# Patient Record
Sex: Male | Born: 1990 | Race: Black or African American | Hispanic: No | Marital: Single | Smoking: Current every day smoker
Health system: Southern US, Community
[De-identification: ages and names within clinical notes are randomized; demographics above are authoritative.]

## PROBLEM LIST (undated history)

## (undated) DIAGNOSIS — F329 Major depressive disorder, single episode, unspecified: Secondary | ICD-10-CM

## (undated) DIAGNOSIS — L309 Dermatitis, unspecified: Secondary | ICD-10-CM

## (undated) DIAGNOSIS — F319 Bipolar disorder, unspecified: Secondary | ICD-10-CM

## (undated) DIAGNOSIS — F32A Depression, unspecified: Secondary | ICD-10-CM

---

## 2012-05-10 ENCOUNTER — Ambulatory Visit (HOSPITAL_COMMUNITY)
Admission: AD | Admit: 2012-05-10 | Discharge: 2012-05-10 | Disposition: A | Discharge status: Home / Self Care | Payer: Medicaid Other | Attending: Psychiatry | Admitting: Psychiatry

## 2012-05-10 ENCOUNTER — Encounter (HOSPITAL_COMMUNITY): Payer: Self-pay | Admitting: *Deleted

## 2012-05-10 ENCOUNTER — Encounter (HOSPITAL_COMMUNITY): Payer: Self-pay | Admitting: Emergency Medicine

## 2012-05-10 ENCOUNTER — Emergency Department (HOSPITAL_COMMUNITY)
Admission: EM | Admit: 2012-05-10 | Discharge: 2012-05-11 | Disposition: A | Discharge status: Other Acute Inpatient Hospital | Payer: Medicaid Other | Source: Home / Self Care | Attending: Emergency Medicine | Admitting: Emergency Medicine

## 2012-05-10 DIAGNOSIS — R45851 Suicidal ideations: Secondary | ICD-10-CM

## 2012-05-10 DIAGNOSIS — F1910 Other psychoactive substance abuse, uncomplicated: Secondary | ICD-10-CM | POA: Insufficient documentation

## 2012-05-10 DIAGNOSIS — R443 Hallucinations, unspecified: Secondary | ICD-10-CM | POA: Insufficient documentation

## 2012-05-10 DIAGNOSIS — L309 Dermatitis, unspecified: Secondary | ICD-10-CM

## 2012-05-10 DIAGNOSIS — F1220 Cannabis dependence, uncomplicated: Secondary | ICD-10-CM | POA: Insufficient documentation

## 2012-05-10 DIAGNOSIS — F39 Unspecified mood [affective] disorder: Secondary | ICD-10-CM | POA: Insufficient documentation

## 2012-05-10 DIAGNOSIS — F1110 Opioid abuse, uncomplicated: Secondary | ICD-10-CM | POA: Insufficient documentation

## 2012-05-10 HISTORY — DX: Bipolar disorder, unspecified: F31.9

## 2012-05-10 HISTORY — DX: Dermatitis, unspecified: L30.9

## 2012-05-10 LAB — CBC
HCT: 40.6 % (ref 39.0–52.0)
Hemoglobin: 14.2 g/dL (ref 13.0–17.0)
MCV: 85.1 fL (ref 78.0–100.0)
Platelets: 234 10*3/uL (ref 150–400)
RBC: 4.77 MIL/uL (ref 4.22–5.81)
WBC: 7 10*3/uL (ref 4.0–10.5)

## 2012-05-10 LAB — RAPID URINE DRUG SCREEN, HOSP PERFORMED
Amphetamines: NOT DETECTED
Barbiturates: NOT DETECTED

## 2012-05-10 LAB — SALICYLATE LEVEL: Salicylate Lvl: <2 mg/dL — ABNORMAL LOW (ref 2.8–20.0)

## 2012-05-10 LAB — COMPREHENSIVE METABOLIC PANEL
AST: 19 U/L (ref 0–37)
CO2: 28 mEq/L (ref 19–32)
Chloride: 100 mEq/L (ref 96–112)
Creatinine, Ser: 0.91 mg/dL (ref 0.50–1.35)
GFR calc non Af Amer: >90 mL/min (ref >90)
Glucose, Bld: 87 mg/dL (ref 70–99)
Total Bilirubin: 0.3 mg/dL (ref 0.3–1.2)
Total Protein: 7.3 g/dL (ref 6.0–8.3)

## 2012-05-10 MED ORDER — ONDANSETRON HCL 4 MG PO TABS: 4.0000 mg | ORAL_TABLET | Freq: Three times a day (TID) | ORAL | Status: DC | PRN

## 2012-05-10 MED ORDER — LORAZEPAM 1 MG PO TABS: 1.0000 mg | ORAL_TABLET | Freq: Three times a day (TID) | ORAL | Status: DC | PRN

## 2012-05-10 MED ORDER — ZOLPIDEM TARTRATE 5 MG PO TABS: 5.0000 mg | ORAL_TABLET | Freq: Every evening | ORAL | Status: DC | PRN

## 2012-05-10 MED ORDER — ALUM & MAG HYDROXIDE-SIMETH 200-200-20 MG/5ML PO SUSP: 30.0000 mL | ORAL | Status: DC | PRN

## 2012-05-10 MED ORDER — NICOTINE 21 MG/24HR TD PT24
21.0000 mg | MEDICATED_PATCH | Freq: Every day | TRANSDERMAL | Status: DC
  Administered 2012-05-10 – 2012-05-11 (×2): 21 mg via TRANSDERMAL
  Filled 2012-05-10 (×2): qty 1

## 2012-05-10 NOTE — ED Provider Notes (Addendum)
History     CSN: [id number]  Arrival date & time 05/10/12  1332   First MD Initiated Contact with Patient 05/10/12 1347      Chief Complaint  Patient presents with  . Medical Clearance    (Consider location/radiation/quality/duration/timing/severity/associated sxs/prior treatment) HPI Pt presents with c/o depression, hearing voices, feeling suicidal and having homicidal thoughts.  He has a hx of bipolar.  Denies any recent illness including no fever, vomiting/diarrhea, no cough.  States he has been taking his medication normally.  Also states he uses substances, marijuana and narcotics.  There are no other associated systemic symptoms, there are no other alleviating or modifying factors.   Past Medical History  Diagnosis Date  . Eczema 05/10/2012  . Bipolar affective     History reviewed. No pertinent past surgical history.  History reviewed. No pertinent family history.  History  Substance Use Topics  . Smoking status: Current Every Day Smoker -- 0.2 packs/day for 4 years  . Smokeless tobacco: Never Used  . Alcohol Use:       Review of Systems ROS reviewed and all otherwise negative except for mentioned in HPI  Allergies  Review of patient's allergies indicates no known allergies.  Home Medications   Current Outpatient Rx  Name Route Sig Dispense Refill  . ARIPIPRAZOLE 10 MG PO TABS Oral Take 10 mg by mouth daily.     Marland Kitchen MINOCYCLINE HCL 100 MG PO CAPS Oral Take 100 mg by mouth 2 (two) times daily.      BP 139/82  Pulse 58  Temp 97.9 F (36.6 C) (Oral)  SpO2 100% Vitals reviewed Physical Exam Physical Examination: General appearance - alert, well appearing, and in no distress Mental status - alert, oriented to person, place, and time Eyes - pupils equal and reactive, no conjunctival injection, no scleral icterus Mouth - mucous membranes moist, pharynx normal without lesions Chest - clear to auscultation, no wheezes, rales or rhonchi, symmetric air  entry Heart - normal rate, regular rhythm, normal S1, S2, no murmurs, rubs, clicks or gallops Extremities - peripheral pulses normal, no pedal edema, no clubbing or cyanosis Skin - normal coloration and turgor, no rashes Psych- flat affect, calm and cooperative  ED Course  Procedures (including critical care time)  Labs Reviewed  COMPREHENSIVE METABOLIC PANEL - Abnormal; Notable for the following:    Potassium 3.4 (*)     All other components within normal limits  SALICYLATE LEVEL - Abnormal; Notable for the following:    Salicylate Lvl <2.0 (*)     All other components within normal limits  URINE RAPID DRUG SCREEN (HOSP PERFORMED) - Abnormal; Notable for the following:    Benzodiazepines POSITIVE (*)     Tetrahydrocannabinol POSITIVE (*)     All other components within normal limits  ACETAMINOPHEN LEVEL  CBC  ETHANOL   No results found.   1. Hallucination   2. Suicidal ideation   3. Substance abuse       MDM  Pt presents after assessment at [facility] for depression, SI/HI, substance abuse, paranoia.  Per ACT team he has been accepted there but no bed available.  Medical clearance labs obtained, holding orders written.          Ethelda Chick, MD 05/10/12 1548  7:57 AM  Pt seen and evaluated in the psych ED. He is resting comfortably, remains calm and cooperative.  He had been accepted at [facility] prior to arrival to ED.  Awaiting bed availability.  Ethelda Chick, MD 05/11/12 [phone]
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
## 2012-05-10 NOTE — ED Notes (Signed)
Pt from [facility] for medical clearance.  Pt with SI with plan to hang self, HI towards former friends in [facility], visual hallucinations of shadows, paranoia with fears that a plane will crash into his house and that people are out to kill him.  Pt uses marijuana daily, takes Vicodin and Percocet recreationally.  Pt calm and cooperative at this time.

## 2012-05-11 ENCOUNTER — Encounter (HOSPITAL_COMMUNITY): Payer: Self-pay | Admitting: *Deleted

## 2012-05-11 ENCOUNTER — Inpatient Hospital Stay (HOSPITAL_COMMUNITY)
Admission: AD | Admit: 2012-05-11 | Discharge: 2012-05-14 | DRG: 885 | Disposition: A | Discharge status: Home / Self Care | Payer: Medicaid Other | Source: Ambulatory Visit | Attending: Psychiatry | Admitting: Psychiatry

## 2012-05-11 DIAGNOSIS — R6250 Unspecified lack of expected normal physiological development in childhood: Secondary | ICD-10-CM | POA: Diagnosis present

## 2012-05-11 DIAGNOSIS — F3189 Other bipolar disorder: Principal | ICD-10-CM | POA: Diagnosis present

## 2012-05-11 DIAGNOSIS — F1994 Other psychoactive substance use, unspecified with psychoactive substance-induced mood disorder: Secondary | ICD-10-CM | POA: Diagnosis present

## 2012-05-11 DIAGNOSIS — F1210 Cannabis abuse, uncomplicated: Secondary | ICD-10-CM | POA: Diagnosis present

## 2012-05-11 DIAGNOSIS — F1110 Opioid abuse, uncomplicated: Secondary | ICD-10-CM | POA: Diagnosis present

## 2012-05-11 DIAGNOSIS — F17200 Nicotine dependence, unspecified, uncomplicated: Secondary | ICD-10-CM | POA: Diagnosis present

## 2012-05-11 DIAGNOSIS — Z23 Encounter for immunization: Secondary | ICD-10-CM

## 2012-05-11 DIAGNOSIS — F4010 Social phobia, unspecified: Secondary | ICD-10-CM

## 2012-05-11 DIAGNOSIS — F1290 Cannabis use, unspecified, uncomplicated: Secondary | ICD-10-CM

## 2012-05-11 DIAGNOSIS — Z79899 Other long term (current) drug therapy: Secondary | ICD-10-CM

## 2012-05-11 DIAGNOSIS — F3181 Bipolar II disorder: Secondary | ICD-10-CM | POA: Diagnosis present

## 2012-05-11 DIAGNOSIS — L259 Unspecified contact dermatitis, unspecified cause: Secondary | ICD-10-CM | POA: Diagnosis present

## 2012-05-11 DIAGNOSIS — R45851 Suicidal ideations: Secondary | ICD-10-CM

## 2012-05-11 DIAGNOSIS — F1120 Opioid dependence, uncomplicated: Secondary | ICD-10-CM | POA: Diagnosis present

## 2012-05-11 HISTORY — DX: Depression, unspecified: F32.A

## 2012-05-11 HISTORY — DX: Major depressive disorder, single episode, unspecified: F32.9

## 2012-05-11 MED ORDER — NAPROXEN 500 MG PO TABS: 500.0000 mg | ORAL_TABLET | Freq: Two times a day (BID) | ORAL | Status: DC | PRN

## 2012-05-11 MED ORDER — MINOCYCLINE HCL 100 MG PO CAPS
100.0000 mg | ORAL_CAPSULE | Freq: Two times a day (BID) | ORAL | Status: DC
  Administered 2012-05-11 (×2): 100 mg via ORAL
  Filled 2012-05-11 (×2): qty 1

## 2012-05-11 MED ORDER — MAGNESIUM HYDROXIDE 400 MG/5ML PO SUSP: 30.0000 mL | Freq: Every day | ORAL | Status: DC | PRN

## 2012-05-11 MED ORDER — DICYCLOMINE HCL 20 MG PO TABS: 20.0000 mg | ORAL_TABLET | Freq: Four times a day (QID) | ORAL | Status: DC | PRN

## 2012-05-11 MED ORDER — ALUM & MAG HYDROXIDE-SIMETH 200-200-20 MG/5ML PO SUSP: 30.0000 mL | ORAL | Status: DC | PRN

## 2012-05-11 MED ORDER — CLONIDINE HCL 0.1 MG PO TABS
0.1000 mg | ORAL_TABLET | Freq: Four times a day (QID) | ORAL | Status: DC
  Administered 2012-05-11 – 2012-05-12 (×2): 0.1 mg via ORAL
  Filled 2012-05-11 (×7): qty 1

## 2012-05-11 MED ORDER — ACETAMINOPHEN 325 MG PO TABS: 650.0000 mg | ORAL_TABLET | Freq: Four times a day (QID) | ORAL | Status: DC | PRN

## 2012-05-11 MED ORDER — ONDANSETRON 4 MG PO TBDP: 4.0000 mg | ORAL_TABLET | Freq: Four times a day (QID) | ORAL | Status: DC | PRN

## 2012-05-11 MED ORDER — CLONIDINE HCL 0.1 MG PO TABS: 0.1000 mg | ORAL_TABLET | ORAL | Status: DC

## 2012-05-11 MED ORDER — CLONIDINE HCL 0.1 MG PO TABS: 0.1000 mg | ORAL_TABLET | Freq: Every day | ORAL | Status: DC

## 2012-05-11 MED ORDER — LOPERAMIDE HCL 2 MG PO CAPS: 2.0000 mg | ORAL_CAPSULE | ORAL | Status: DC | PRN

## 2012-05-11 MED ORDER — HYDROXYZINE HCL 25 MG PO TABS: 25.0000 mg | ORAL_TABLET | Freq: Four times a day (QID) | ORAL | Status: DC | PRN

## 2012-05-11 MED ORDER — TRAZODONE HCL 50 MG PO TABS
50.0000 mg | ORAL_TABLET | Freq: Every evening | ORAL | Status: DC | PRN
  Administered 2012-05-11 – 2012-05-13 (×3): 50 mg via ORAL
  Filled 2012-05-11 (×10): qty 1

## 2012-05-11 MED ORDER — METHOCARBAMOL 500 MG PO TABS: 500.0000 mg | ORAL_TABLET | Freq: Three times a day (TID) | ORAL | Status: DC | PRN

## 2012-05-11 MED ORDER — ARIPIPRAZOLE 10 MG PO TABS
10.0000 mg | ORAL_TABLET | Freq: Every day | ORAL | Status: DC
  Administered 2012-05-11: 10 mg via ORAL
  Filled 2012-05-11: qty 1

## 2012-05-12 ENCOUNTER — Encounter (HOSPITAL_COMMUNITY): Payer: Self-pay | Admitting: *Deleted

## 2012-05-12 DIAGNOSIS — F1290 Cannabis use, unspecified, uncomplicated: Secondary | ICD-10-CM | POA: Diagnosis present

## 2012-05-12 DIAGNOSIS — F1120 Opioid dependence, uncomplicated: Secondary | ICD-10-CM | POA: Diagnosis present

## 2012-05-12 DIAGNOSIS — F4010 Social phobia, unspecified: Secondary | ICD-10-CM | POA: Diagnosis present

## 2012-05-12 DIAGNOSIS — F1010 Alcohol abuse, uncomplicated: Secondary | ICD-10-CM

## 2012-05-12 DIAGNOSIS — F332 Major depressive disorder, recurrent severe without psychotic features: Secondary | ICD-10-CM

## 2012-05-12 DIAGNOSIS — F1994 Other psychoactive substance use, unspecified with psychoactive substance-induced mood disorder: Secondary | ICD-10-CM | POA: Diagnosis present

## 2012-05-12 LAB — TSH: TSH: 0.387 u[IU]/mL (ref 0.350–4.500)

## 2012-05-12 MED ORDER — PROPRANOLOL HCL ER 60 MG PO CP24
60.0000 mg | ORAL_CAPSULE | Freq: Every day | ORAL | Status: DC
  Administered 2012-05-12 – 2012-05-14 (×3): 60 mg via ORAL
  Filled 2012-05-12 (×5): qty 1

## 2012-05-12 MED ORDER — RISPERIDONE 1 MG PO TABS
1.0000 mg | ORAL_TABLET | Freq: Every day | ORAL | Status: DC
  Filled 2012-05-12: qty 1

## 2012-05-12 MED ORDER — PNEUMOCOCCAL VAC POLYVALENT 25 MCG/0.5ML IJ INJ: 0.5000 mL | INJECTION | INTRAMUSCULAR | Status: AC

## 2012-05-12 MED ORDER — RISPERIDONE 0.25 MG PO TABS
0.2500 mg | ORAL_TABLET | Freq: Two times a day (BID) | ORAL | Status: DC
  Filled 2012-05-12 (×2): qty 1

## 2012-05-12 MED ORDER — RISPERIDONE 0.25 MG PO TABS
0.2500 mg | ORAL_TABLET | Freq: Two times a day (BID) | ORAL | Status: DC
  Administered 2012-05-12 – 2012-05-13 (×2): 0.25 mg via ORAL
  Filled 2012-05-12 (×6): qty 1

## 2012-05-12 MED ORDER — CITALOPRAM HYDROBROMIDE 10 MG PO TABS
10.0000 mg | ORAL_TABLET | Freq: Every day | ORAL | Status: DC
  Administered 2012-05-12: 10 mg via ORAL
  Filled 2012-05-12 (×2): qty 1

## 2012-05-12 MED ORDER — NICOTINE 7 MG/24HR TD PT24
7.0000 mg | MEDICATED_PATCH | Freq: Every day | TRANSDERMAL | Status: DC
  Administered 2012-05-12 – 2012-05-14 (×3): 7 mg via TRANSDERMAL
  Filled 2012-05-12 (×5): qty 1

## 2012-05-12 MED ORDER — CLONIDINE HCL ER 0.1 MG PO TB12: 0.1000 mg | ORAL_TABLET | ORAL | Status: DC | PRN

## 2012-05-12 MED ORDER — INFLUENZA VIRUS VACC SPLIT PF IM SUSP: 0.5000 mL | INTRAMUSCULAR | Status: AC

## 2012-05-12 MED ORDER — RISPERIDONE 1 MG PO TABS
1.0000 mg | ORAL_TABLET | Freq: Every day | ORAL | Status: DC
  Administered 2012-05-12: 1 mg via ORAL
  Filled 2012-05-12 (×3): qty 1

## 2012-05-12 NOTE — [facility] Suicide Risk Assessment (Signed)
Suicide Risk Assessment  Admission Assessment     Nursing information obtained from:  Patient Demographic factors:  Male;Adolescent or young adult;Low socioeconomic status;Unemployed Current Mental Status:  NA (Currently denies) Loss Factors:  Loss of significant relationship;Financial problems / change in socioeconomic status Historical Factors:  NA Risk Reduction Factors:  Sense of responsibility to family;Responsible for children under 19 years of age;Positive coping skills or problem solving skills (GF may be pregnant)  CLINICAL FACTORS:   Severe Anxiety and/or Agitation Bipolar Disorder:   Bipolar II Alcohol/Substance Abuse/Dependencies Previous Psychiatric Diagnoses and Treatments  COGNITIVE FEATURES THAT CONTRIBUTE TO RISK:  Thought constriction (tunnel vision)    SUICIDE RISK:   Moderate:  Frequent suicidal ideation with limited intensity, and duration, some specificity in terms of plans, no associated intent, good self-control, limited dysphoria/symptomatology, some risk factors present, and identifiable protective factors, including available and accessible social support.  Reason for hospitalization: .suicidal thoughts ans substance abuse issues  Risk: Risk of harm to self is elevated by his anxiety, his bipolar disorder, and his addiction issues.  Risk of harm to others is minimal in that he has not been involved in fights or had any legal charges filed on him.  Start Risperdal for bipolar disorder and Inderal LA for social anxiety.  Discussed the risks, benefits, and probable clinical course with and without treatment.  Pt is agreeable to the current course of treatment. We will continue on q. 15 checks the unit protocol. At this time there is no clinical indication for one-to-one observation as patient contract for safety and presents little risk to harm themself and others.  We will increase collateral information. I encourage patient to participate in group milieu  therapy. Pt will be seen in treatment team soon for further treatment and appropriate discharge planning. Please see history and physical note for more detailed information ELOS: 3 to 5 days.    Marylee Belzer 05/12/2012, 10:10 PM

## 2012-05-12 NOTE — Progress Notes (Signed)
Patient seen during during d/c planning group and or treatment team.  He reports admitting to the hospital due to abusing THC and opiods.  He shared he has been smoking THC for six years and using perocets for a year and a half.  He currently denies SI/HI.  He rates depression and anxiety at three.  Patient is not interested in residential treatment.  He plans to return to his mother's home and follow up with outpatient services.
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
## 2012-05-12 NOTE — Progress Notes (Signed)
[facility] Group Notes:  (Counselor/Nursing/MHT/Case Management/Adjunct) 05/12/2012  1:15pm-2:30pm Feelings About Diagnosis   Type of Therapy:  Group Therapy  Participation Level:  Did Not Attend     Angus Palms, LCSW 05/12/2012  4:06 PM

## 2012-05-12 NOTE — Progress Notes (Signed)
Psychoeducational Group Note  Date:  05/12/2012 Time: 1000   Group Topic/Focus:  Self Care:   The focus of this group is to help patients understand the importance of self-care in order to improve or restore emotional, physical, spiritual, interpersonal, and financial health.  Participation Level:  Did Not Attend  Participation Quality:    Affect:    Cognitive:    Insight:    Engagement in Group:    Additional Comments:  none  Brian Buchanan 05/12/2012, 12:47 PM
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
## 2012-05-12 NOTE — Tx Team (Signed)
Interdisciplinary Treatment Plan Update (Adult)  Date:  05/12/2012  Time Reviewed:  10:08 AM   Progress in Treatment: Attending groups:   Yes   Participating in groups:  Yes Taking medication as prescribed:  Yes Tolerating medication:  Yes Family/Significant othe contact made: Counselor to follow up on collateral contat Patient understands diagnosis:  Yes Discussing patient identified problems/goals with staff: Yes Medical problems stabilized or resolved: Yes Denies suicidal/homicidal ideation:Yes Issues/concerns per patient self-inventory:  Other:  New problem(s) identified:  Reason for Continuation of Hospitalization: Medication stabilization Suicidal ideation  Interventions implemented related to continuation of hospitalization:  Medication Management; safety checks q 15 mins  Additional comments:  Estimated length of stay: 3-4  days  Discharge Plan: Home with outpatient follow up  New goal(s):  Review of initial/current patient goals per problem list:    1.  Goal(s): Detox from Opiods  Met:  No  Target date: d/c  As evidenced by:  Detox will be completed  2.  Goal (s): .stabilize on meds   Met:  No  Target date: d/c  As evidenced by: Patient reports being stabilized on medications - less symptomatic    3.  Goal(s): Eliminate SI/other thoughts of self harm   Met:  Yes  Target date: d/c  As evidenced by:  Patient no longer endorsing SI/HI or other thoughts of self harm.    4.  Goal(s): Refer for outpatient follow up   Met:  No  Target date: d/c  As evidenced by: Outpatient follow up will be scheduled  Attendees: Patient:  Brian Buchanan 05/12/2012 10:24 AM  Nursing:  Quintella Reichert, RN  05/12/2012 10:24 AM  Physician:  Orson Aloe, MD 05/12/2012 10:08 AM   Nursing:  Jacques Navy, RN  05/12/2012 10:08 AM   CaseManager:  Juline Patch, LCSW 05/12/2012 10:08 AM   Counselor:  Angus Palms, LCSW 05/12/2012 10:08 AM   Other:   Dennison Nancy, RN      05/12/2012 10:25 AM

## 2012-05-12 NOTE — Tx Team (Signed)
Initial Interdisciplinary Treatment Plan  PATIENT STRENGTHS: (choose at least two) Ability for insight Active sense of humor Capable of independent living Communication skills General fund of knowledge Motivation for treatment/growth Physical Health Supportive family/friends  PATIENT STRESSORS: Financial difficulties Substance abuse arguing with friends   PROBLEM LIST: Problem List/Patient Goals Date to be addressed Date deferred Reason deferred Estimated date of resolution  Depression      SI      HI      Substance abuse                                     DISCHARGE CRITERIA:  Ability to meet basic life and health needs Adequate post-discharge living arrangements Improved stabilization in mood, thinking, and/or behavior Motivation to continue treatment in a less acute level of care Need for constant or close observation no longer present Reduction of life-threatening or endangering symptoms to within safe limits Safe-care adequate arrangements made Verbal commitment to aftercare and medication compliance Withdrawal symptoms are absent or subacute and managed without 24-hour nursing intervention  PRELIMINARY DISCHARGE PLAN: Attend 12-step recovery group Outpatient therapy Return to previous living arrangement  PATIENT/FAMIILY INVOLVEMENT: This treatment plan has been presented to and reviewed with the patient, Brian Buchanan.  The patient has been given the opportunity to ask questions and make suggestions.  [name] 05/12/2012, 12:16 AM

## 2012-05-12 NOTE — Progress Notes (Signed)
Pt observed in his room in bed with covers pulled up to his head.  Pt easily responds to his name.  He reports he is not depressed nor anxious.  He denies SI/HI/AV.  He has been isolating to his room.  He did not attend wrap-up group this evening.  Encouraged pt to make his needs known to staff.  Spoke with pt about his meds ordered for the night.  Pt voiced understanding.  He voiced no needs/concerns at this time.  Pt intends to return to his mother's home at discharge.  Safety maintained with q15 minute checks.

## 2012-05-12 NOTE — Progress Notes (Addendum)
D:  Patient's self inventory sheet, patient stated he has fair sleep, poor appetite, low energy level, improving attention span.  Denied depression, hopelessness and anxiety.  Denied withdrawals.  Denied SI.  No physical problems in pat 24 hours.  Not sure of discharge plans.   No questions for staff.  Does have discharge plans to go back and live with his mother.  Does have money to purchase his medications. A:  Medications given per MD order.  Support and encouragement given throughout day.  Support and safety hecks completed as ordered. R:  Following treatment plan.   Denied SI and HI.  Denied A/V hallucinations.  Patient remains safe and receptive on unit.  Patient has attended one group today.  Did not attend two groups today.   Has been in bed resting most of day.  Encouraged patient to attend groups. Patient signed voluntary admission form per MD order.
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
## 2012-05-12 NOTE — Progress Notes (Signed)
Psychoeducational Group Note  Date:  05/12/2012 Time:  1100  Group Topic/Focus:  Educational Video  Participation Level:  Did Not Attend  Participation Quality:    Affect:    Cognitive:    Insight:    Engagement in Group:    Additional Comments:  none  Marquis Lunch, Latash Nouri 05/12/2012, 6:37 PM

## 2012-05-13 DIAGNOSIS — F3181 Bipolar II disorder: Secondary | ICD-10-CM | POA: Diagnosis present

## 2012-05-13 MED ORDER — RISPERIDONE 1 MG PO TABS
1.5000 mg | ORAL_TABLET | Freq: Every day | ORAL | Status: DC
  Administered 2012-05-13: 1.5 mg via ORAL
  Filled 2012-05-13: qty 1
  Filled 2012-05-13: qty 3
  Filled 2012-05-13 (×2): qty 1

## 2012-05-13 NOTE — Progress Notes (Addendum)
[facility] MD Progress Note  05/13/2012 1:54 PM  Name: Brian Buchanan, Brian Buchanan MRN: [id number]  Diagnosis:  AXIS I: Bipolar II Dis, Polysubstance Abuse; Marijuana abuse AXIS II: Learning disabilities  AXIS III:  Past Medical History   Diagnosis  Date   .  Eczema  05/10/2012   .  Bipolar affective    .  Depression     AXIS IV: other psychosocial or environmental problems and problems related to social environment  AXIS V: 41-50 serious symptoms  Subjective:  "I feel good today. I don't want a cigarette and hasn't been wanted to smoke marijuana. " "Me and my mom have been getting along better and were planning to spend more time together take walks together go out to dinner. I am planning to leave the house more often in return to counseling twice per week which helped me before in the past."  Mood: "Im not depressed at all."  Depressed: "0/10" Anxiety: "0/10" Hopelessness "1/10"    ADL's:  Impaired Hair unkempt. Hygiene less than fair  Sleep: Good "I slept good I went straight to bed and didn't have any issues. Slept throughout the night. I am groggy though, still sleepy."  Appetite:  Good "Ive been eating good."  Suicidal Ideation:  Plan:  Denies Intent:  Denies Means:  9 every 15 minute monitoring being completed Homicidal Ideation:  Plan:  Denies Intent:  Deny Means:  None  AEB (as evidenced by):  Mental Status Examination/Evaluation: Objective:  Appearance: Hygiene less than fair  Eye Contact::  Minimal  Speech:  Slow  Volume:  Decreased  Mood:  Depressed  Affect:  Flat  Thought Process:  Goal Directed and Linear  Orientation:  Full  Thought Content:  WDL  Suicidal Thoughts:  No  Homicidal Thoughts:  No  Memory:  Immediate;   Good Recent;   Good Remote;   Good  Judgement:  Impaired  Insight:  Lacking  Psychomotor Activity:  Normal  Concentration:  Good  Recall:  Good  Akathisia:  No  Handed:    AIMS (if indicated):     Assets:  Social Support  Sleep:   Number of Hours: 6.75    Vital Signs:Blood pressure 112/77, pulse 109, temperature 97.6 F (36.4 C), temperature source Oral, resp. rate 18, height 5' 7.5" (1.715 m), weight 55.339 kg (122 lb). Current Medications: Current Facility-Administered Medications  Medication Dose Route Frequency Provider Last Rate Last Dose  . acetaminophen (TYLENOL) tablet 650 mg  650 mg Oral Q6H PRN Kerry Hough, PA      . alum & mag hydroxide-simeth (MAALOX/MYLANTA) 200-200-20 MG/5ML suspension 30 mL  30 mL Oral Q4H PRN Kerry Hough, PA      . cloNIDine HCl (KAPVAY) ER tablet 0.1 mg  0.1 mg Oral PRN Mike Craze, MD      . dicyclomine (BENTYL) tablet 20 mg  20 mg Oral Q6H PRN Kerry Hough, PA      . hydrOXYzine (ATARAX/VISTARIL) tablet 25 mg  25 mg Oral Q6H PRN Kerry Hough, PA      . influenza  inactive virus vaccine (FLUZONE/FLUARIX) injection 0.5 mL  0.5 mL Intramuscular Tomorrow-1000 Mike Craze, MD      . loperamide (IMODIUM) capsule 2-4 mg  2-4 mg Oral PRN Kerry Hough, PA      . magnesium hydroxide (MILK OF MAGNESIA) suspension 30 mL  30 mL Oral Daily PRN Kerry Hough, PA      . methocarbamol (ROBAXIN) tablet 500  mg  500 mg Oral Q8H PRN Kerry Hough, PA      . naproxen (NAPROSYN) tablet 500 mg  500 mg Oral BID PRN Kerry Hough, PA      . nicotine (NICODERM CQ - dosed in mg/24 hr) patch 7 mg  7 mg Transdermal Daily Mike Craze, MD   7 mg at 05/13/12 0738  . ondansetron (ZOFRAN-ODT) disintegrating tablet 4 mg  4 mg Oral Q6H PRN Kerry Hough, PA      . pneumococcal 23 valent vaccine (PNU-IMMUNE) injection 0.5 mL  0.5 mL Intramuscular Tomorrow-1000 Mike Craze, MD      . propranolol ER (INDERAL LA) 24 hr capsule 60 mg  60 mg Oral Daily Mike Craze, MD   60 mg at 05/13/12 0738  . risperiDONE (RISPERDAL) tablet 0.25 mg  0.25 mg Oral BID Mike Craze, MD   0.25 mg at 05/13/12 0738  . risperiDONE (RISPERDAL) tablet 1 mg  1 mg Oral QHS Mike Craze, MD   1 mg at 05/12/12  2140  . traZODone (DESYREL) tablet 50 mg  50 mg Oral QHS,MR X 1 Kerry Hough, PA   50 mg at 05/12/12 2140    Lab Results:  Results for orders placed during the hospital encounter of 05/11/12 (from the past 48 hour(s))  TSH     Status: Normal   Collection Time   05/12/12  6:22 AM      Component Value Range Comment   TSH 0.387  0.350 - 4.500 uIU/mL     Physical Findings:  AIMS: Facial and Oral Movements Muscles of Facial Expression: None, normal Lips and Perioral Area: None, normal Jaw: None, normal Tongue: None, normal,Extremity Movements Upper (arms, wrists, hands, fingers): None, normal Lower (legs, knees, ankles, toes): None, normal,  Trunk Movements- None Neck, shoulders, hips: None, normal,  Overall Severity Severity of abnormal movements (highest score from questions above):  None, normal Incapacitation due to abnormal movements: None, normal Patient's awareness of abnormal movements (rate only patient's report): none,  Dental Status Current problems with teeth and/or dentures?: No Does patient usually wear dentures?: No  CIWA:  CIWA-Ar Total: 0  COWS:  COWS Total Score: 1   Treatment Plan Summary: Daily contact with patient to assess and evaluate symptoms and progress in treatment Therapeutic milieu Medication management Discharge planning  Plan: Will consult with Dr. Dan Humphreys regarding patients flat, depressive affect. And medication changes.   Norval Gable FNP-BC 05/13/2012, 1:54 PM

## 2012-05-13 NOTE — Progress Notes (Signed)
  D) Patient resting quietly in bed upon my assessment. Patient forwards little when assessed by staff. Patient states slept "well," and  appetite is "good." Patient rates depression as  1 /10, patient rates hopeless feelings as  1/10. Patient denies SI/HI, denies A/V hallucinations.   A) Patient offered support and encouragement, patient encouraged to discuss feelings/concerns with staff. Patient verbalized understanding. Patient monitored Q15 minutes for safety. Patient met with MD and treatment team to discuss today's goals and plan of care.  R) Patient isolates to room most of this shift, not attending groups in day room. Patient does attend meals in dining room.  Patient has a plan to keep "doin the same rules as I'm doin here and stayin clean forever" once he is discharged from [facility] Patient taking medications as ordered. Will continue to monitor.

## 2012-05-13 NOTE — Progress Notes (Signed)
Psychoeducational Group Note  Date:  05/13/2012 Time: 1100   Group Topic/Focus:  Coping With Mental Health Crisis:   The purpose of this group is to help patients identify strategies for coping with mental health crisis.  Group discusses possible causes of crisis and ways to manage them effectively.  Participation Level:  Active  Participation Quality:  Appropriate  Affect:  Appropriate  Cognitive:  Alert and Appropriate  Insight:  Good  Engagement in Group:  Limited  Additional Comments:  Pt. Listened attentively and participated in filling out worksheet.   Ruta Hinds [facility] 05/13/2012, 1:27 PM

## 2012-05-13 NOTE — Progress Notes (Signed)
Pt observed in his room in bed with the covers over most of his head.  He responds when his name is called.  He denies SI/HI/AV.  He voices no concerns/needs.  He says he attended one group and was up to the dayroom earlier.  Encouraged pt to be up as much as possible.  He voices understanding.  He does ask about sleep med as he says although he has been in bed a lot today, he did not sleep.  Informed pt that he could receive a sleep med at bedtime.  Safety maintained with q15 minute checks.

## 2012-05-13 NOTE — Progress Notes (Signed)
[facility] Group Notes:  (Counselor/Nursing/MHT/Case Management/Adjunct) 05/13/2012  1:15pm-2:30pm Emotion Regulation   Type of Therapy:  Group Therapy  Participation Level:  Did Not Attend     Angus Palms, LCSW 05/13/2012  3:59 PM

## 2012-05-13 NOTE — Progress Notes (Signed)
Psychoeducational Group Note  Date:  05/13/2012 Time:  1000 Group Topic/Focus:  Personal Development- Wisdom Cards Activtiy  Participation Level:  Active  Participation Quality:  Appropriate, Attentive and Sharing  Affect:  Appropriate  Cognitive:  Appropriate  Insight:  Good  Engagement in Group:  Good  Additional Comments:  Patient participated in Wisdom cards activity. Pt was given two random cards and stated what the card said and how it applied in her life or how it could become apart of her life. This activity promotes positive aspects of how pt should view different view points for their life and how changes could be made if necessary. This relates to personal development with having positive coping skills with daily tasks or situations that occur within pt live.  Karleen Hampshire Brittini 05/13/2012, 12:01 PM

## 2012-05-14 DIAGNOSIS — F1910 Other psychoactive substance abuse, uncomplicated: Secondary | ICD-10-CM

## 2012-05-14 DIAGNOSIS — F3189 Other bipolar disorder: Principal | ICD-10-CM

## 2012-05-14 MED ORDER — NICOTINE 7 MG/24HR TD PT24: 1.0000 | MEDICATED_PATCH | Freq: Every day | TRANSDERMAL | Status: DC

## 2012-05-14 MED ORDER — RISPERIDONE 0.5 MG PO TABS: 1.5000 mg | ORAL_TABLET | Freq: Every day | ORAL | Status: DC

## 2012-05-14 MED ORDER — TRAZODONE HCL 50 MG PO TABS: 50.0000 mg | ORAL_TABLET | Freq: Every evening | ORAL | Status: DC | PRN

## 2012-05-14 MED ORDER — PROPRANOLOL HCL ER 60 MG PO CP24: 60.0000 mg | ORAL_CAPSULE | Freq: Every day | ORAL | Status: AC

## 2012-05-14 NOTE — Tx Team (Signed)
Interdisciplinary Treatment Plan Update (Adult)  Date:  05/14/2012  Time Reviewed:  10:13 AM   Progress in Treatment: Attending groups:   Yes   Participating in groups:  Yes Taking medication as prescribed:  Yes Tolerating medication:  Yes Family/Significant othe contact made: Contact made with family Patient understands diagnosis:  Yes Discussing patient identified problems/goals with staff: Yes Medical problems stabilized or resolved: Yes Denies suicidal/homicidal ideation:Yes Issues/concerns per patient self-inventory:  Other:  New problem(s) identified:  Reason for Continuation of Hospitalization:  Interventions implemented related to continuation of hospitalization:  Additional comments:  Estimated length of stay:  Discharge home today  Discharge Plan:   Home with family and outpatient follow up  New goal(s):  Review of initial/current patient goals per problem list:    1.  Goal(s):  Eliminate SI/other thoughts of self harm   Met:  Yes  Target date: d/c  As evidenced by: Patient no longer endorsing SI/HI or other thoughts of self harm.    2.  Goal (s):  Detox from opiods  Met:  Yes  Target date: d/c  As evidenced by: Patient completed detox protocol  3.  Goal(s): .stabilize on meds   Met:  Yes  Target date: d/c  As evidenced by: Patient reports being stabilized on medications - less symptomatic    4.  Goal(s):  Refer for outpatient follow up   Met:  Yes  Target date: d/c  As evidenced by: Follow up appointment scheduled    Attendees: Patient:  Brian Buchanan 05/14/2012 10:13 AM  Family:     Physician:  Orson Aloe, MD 05/14/2012 10:13 AM   Nursing:   Quintella Reichert, RN 05/14/2012 10:13 AM   CaseManager:  Juline Patch, LCSW 05/14/2012 10:13 AM   Counselor:  Angus Palms, LCSW 05/14/2012 10:13 AM   Other:  Foye Clock, MSW Intern    05/14/2012 10:14 AM

## 2012-05-14 NOTE — Progress Notes (Signed)
I agree with this note.  
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
## 2012-05-14 NOTE — [facility] Suicide Risk Assessment (Signed)
Suicide Risk Assessment  Discharge Assessment     Current Mental Status by Physician: Patient denies suicidal or homicidal ideation, hallucinations, illusions, or delusions. Patient engages with good eye contact, is able to focus adequately in a one to one setting, and has clear goal directed thoughts. Patient speaks with a natural conversational volume, rate, and tone. Anxiety was reported at 2 on a scale of 1 the least and 10 the most. Depression was reported at 2 on the same scale. Patient is oriented times 4, recent and remote memory intact. Judgement: improved from admission Insight: improved from admission  Demographic factors:  Male;Adolescent or young adult;Low socioeconomic status;Unemployed Loss Factors:  Loss of significant relationship;Financial problems / change in socioeconomic status Historical Factors:  NA Risk Reduction Factors:  Sense of responsibility to family;Responsible for children under 69 years of age;Positive coping skills or problem solving skills (GF may be pregnant)  Continued Clinical Symptoms:  Severe Anxiety and/or Agitation Bipolar Disorder:   Bipolar II Alcohol/Substance Abuse/Dependencies  Discharge Diagnoses:  AXIS I: Bipolar II Dis, Polysubstance Abuse; Marijuana abuse  AXIS II: Learning disabilities  AXIS III:  Past Medical History   Diagnosis  Date   .  Eczema  05/10/2012   .  Bipolar affective    .  Depression    AXIS IV: other psychosocial or environmental problems and problems related to social environment  AXIS V: 41-50 serious symptoms  Cognitive Features That Contribute To Risk:  Thought constriction (tunnel vision)    Suicide Risk:  Minimal: No identifiable suicidal ideation.  Patients presenting with no risk factors but with morbid ruminations; may be classified as minimal risk based on the severity of the depressive symptoms  Labs:  Results for orders placed during the hospital encounter of 05/11/12 (from the past 72 hour(s))   TSH     Status: Normal   Collection Time   05/12/12  6:22 AM      Component Value Range Comment   TSH 0.387  0.350 - 4.500 uIU/mL    RISK REDUCTION FACTORS: What pt has learned from hospital stay is that smoking weed and cigarettes is a problem as well as popping pills   Risk of self harm is elevated by anxiety, depression, and substance abuse  Risk of harm to others is minimal in that he has not been involved in fights or had any legal charges filed on him.  Pt seen in treatment team where he divulged the above information. The treatment team concluded that he was ready for discharge and had met him goals for an inpatient setting.  PLAN: Discharge home Continue   Medication List     As of 05/14/2012  1:09 PM    STOP taking these medications         ARIPiprazole 10 MG tablet   Commonly known as: ABILIFY      minocycline 100 MG capsule   Commonly known as: MINOCIN,DYNACIN      TAKE these medications      Indication    nicotine 7 mg/24hr patch   Commonly known as: NICODERM CQ - dosed in mg/24 hr   Place 1 patch onto the skin daily.    Indication: smoking cessation      propranolol ER 60 MG 24 hr capsule   Commonly known as: INDERAL LA   Take 1 capsule (60 mg total) by mouth daily.    Indication: for anxiety      risperiDONE 0.5 MG tablet   Commonly known as: RISPERDAL  Take 3 tablets (1.5 mg total) by mouth at bedtime.    Indication: Manic-Depression, insomnia      traZODone 50 MG tablet   Commonly known as: DESYREL   Take 1 tablet (50 mg total) by mouth at bedtime and may repeat dose one time if needed.    Indication: Trouble Sleeping       Follow-up recommendations:  Activities: Resume typical activities Diet: Resume typical diet Tests: none Other: Follow up with outpatient provider and report any side effects to out patient prescriber.  Is patient on multiple antipsychotic therapies at discharge: No  Has Patient had three or more failed trials of  antipsychotic monotherapy by history: N/A Recommended Plan for Multiple Antipsychotic Therapies: N/A  Dan Humphreys, Tate Zagal 05/14/2012 1:09 PM

## 2012-05-14 NOTE — Progress Notes (Signed)
Patient denies SI/HI, denies A/V hallucinations. Patient verbalizes understanding of discharge instructions, follow up care and prescriptions. Patient given all belongings from [facility] locker. Patient escorted out by staff, transported by family. 

## 2012-05-14 NOTE — Progress Notes (Signed)
[facility] Case Management Discharge Plan:  Will you be returning to the same living situation after discharge: Yes,  Patient to return home with family At discharge, do you have transportation home?:Yes,  Patient to arrangae for family to transport hime home Do you have the ability to pay for your medications:Yes,  Patient has Medicaid  Interagency Information:     Release of information consent forms completed and in the chart;  Patient's signature needed at discharge.  Patient to Follow up at:  Follow-up Information    Follow up with [facility]. (Please go to [facility] walk-in clinic on Monday, May 18, 2012 or any weekday between 8 AM- 3 PM)    Contact information:   [address] [place], [place]  [address]  [phone]         Patient denies SI/HI:   Yes,  Patient no longer endorsing SI/HI or other thoughts of self harm    Safety Planning and Suicide Prevention discussed:  Yes,  Reviewed individually with patient prior to discharging  Barrier to discharge identified:No.  Summary and Recommendations:  Patient encouraged to be compliant with medications and to follow up with all outpatient recommendations.    Wynn Banker 05/14/2012, 10:18 AM

## 2012-05-14 NOTE — Progress Notes (Signed)
  D) Patient quiet but cooperative upon my assessment. Patient continues to forward little when assessed by staff. Patient encouraged to attend groups. Patient states slept "well," and  appetite is "good." Patient rates depression as   1/10, patient rates hopeless feelings as  1/10. Patient denies SI/HI, denies A/V hallucinations.   A) Patient offered support and encouragement, patient encouraged to discuss feelings/concerns with staff. Patient verbalized understanding. Patient monitored Q15 minutes for safety. Patient met with MD to discuss today's goals and plan of care.  R) Patient isolates to room for most of this shift, not attending groups in day room but attending meals in dining room.  Patient taking medications as ordered. Patient verbalizes a plan to "plan on stayin strong and follow the rules that I'm doin here." Will continue to monitor.

## 2012-05-14 NOTE — Progress Notes (Signed)
[facility] Adult Inpatient Family/Significant Other Suicide Prevention Education  Suicide Prevention Education:  Education Completed: Brian Buchanan, mother, [phone]) has been identified by the patient as the family member/significant other with whom the patient will be residing, and identified as the person(s) who will aid the patient in the event of a mental health crisis (suicidal ideations/suicide attempt).  With written consent from the patient, the family member/significant other has been provided the following suicide prevention education, prior to the and/or following the discharge of the patient.  The suicide prevention education provided includes the following:  Suicide risk factors  Suicide prevention and interventions  National Suicide Hotline telephone number  [facility] assessment telephone number  [facility] Emergency Assistance 911  [facility] and/or Residential Mobile Crisis Unit telephone number  Request made of family/significant other to:  Remove weapons (e.g., guns, rifles, knives), all items previously/currently identified as safety concern.    Remove drugs/medications (over-the-counter, prescriptions, illicit drugs), all items previously/currently identified as a safety concern.  Brian Buchanan stated that she has been very concerned for Brian Buchanan as he has been having problems and exhibiting depression for a while, and she is happy that he decided to get help. She was interested in learning about his follow-up appointments and wanted to know the names of his medications so she could look them up. She indicated support of his aftercare. Brian Buchanan verbalized understanding of suicide prevention information and stated that there are no guns in the home, though she is unsure whether some of Brian Buchanan's friends have guns in their homes. Brian Buchanan will be living with her at discharge.   Brian Buchanan 05/14/2012, 11:05 AM

## 2012-05-18 NOTE — Progress Notes (Signed)
Patient Discharge Instructions:  After Visit Summary (AVS):   Faxed to:  05/18/2012 Psychiatric Admission Assessment Note:   Faxed to:  05/18/2012 Suicide Risk Assessment - Discharge Assessment:   Faxed to:  05/18/2012 Faxed/Sent to the Next Level Care provider:  05/18/2012  Faxed [facility] @ [phone]  Wandra Scot, 05/18/2012, 12:45 PM

## 2012-07-07 ENCOUNTER — Emergency Department (HOSPITAL_COMMUNITY)
Admission: EM | Admit: 2012-07-07 | Discharge: 2012-07-07 | Disposition: A | Discharge status: Home / Self Care | Payer: Medicaid Other | Attending: Emergency Medicine | Admitting: Emergency Medicine

## 2012-07-07 ENCOUNTER — Other Ambulatory Visit: Payer: Self-pay

## 2012-07-07 ENCOUNTER — Encounter (HOSPITAL_COMMUNITY): Payer: Self-pay | Admitting: Emergency Medicine

## 2012-07-07 ENCOUNTER — Emergency Department (HOSPITAL_COMMUNITY): Payer: Medicaid Other

## 2012-07-07 DIAGNOSIS — F3289 Other specified depressive episodes: Secondary | ICD-10-CM | POA: Insufficient documentation

## 2012-07-07 DIAGNOSIS — Z862 Personal history of diseases of the blood and blood-forming organs and certain disorders involving the immune mechanism: Secondary | ICD-10-CM | POA: Insufficient documentation

## 2012-07-07 DIAGNOSIS — F319 Bipolar disorder, unspecified: Secondary | ICD-10-CM | POA: Insufficient documentation

## 2012-07-07 DIAGNOSIS — F329 Major depressive disorder, single episode, unspecified: Secondary | ICD-10-CM | POA: Insufficient documentation

## 2012-07-07 DIAGNOSIS — Z79899 Other long term (current) drug therapy: Secondary | ICD-10-CM | POA: Insufficient documentation

## 2012-07-07 DIAGNOSIS — R0602 Shortness of breath: Secondary | ICD-10-CM | POA: Insufficient documentation

## 2012-07-07 DIAGNOSIS — R0789 Other chest pain: Secondary | ICD-10-CM

## 2012-07-07 DIAGNOSIS — R071 Chest pain on breathing: Secondary | ICD-10-CM | POA: Insufficient documentation

## 2012-07-07 DIAGNOSIS — F17200 Nicotine dependence, unspecified, uncomplicated: Secondary | ICD-10-CM | POA: Insufficient documentation

## 2012-07-07 MED ORDER — IBUPROFEN 400 MG PO TABS
600.0000 mg | ORAL_TABLET | Freq: Once | ORAL | Status: AC
  Administered 2012-07-07: 600 mg via ORAL
  Filled 2012-07-07: qty 1

## 2012-07-07 MED ORDER — PROPRANOLOL HCL ER 60 MG PO CP24: 60.0000 mg | ORAL_CAPSULE | Freq: Every day | ORAL | Status: DC

## 2012-07-07 NOTE — ED Notes (Signed)
Cp that started 2 weeks ago fater stopping propanolol states ran out  Some sob no n/v

## 2012-07-07 NOTE — ED Provider Notes (Signed)
I saw and evaluated the patient, reviewed the resident's note and I agree with the findings and plan.  I reviewed and agree with Dr. Eliot Ford ECG interpretation.  Atypical pain.  Pt's age makes ACS highly unlikely.  Sats ok, VS are normal, no suspicion for PE.  CXR is ok per radiologist interpretation.  Pt previously was controlled on propranolol.  Will restart and encourage establishment with PCP for continued outpt management.    Brian Buchanan. Lisha Vitale, MD 07/07/12 1233

## 2012-07-07 NOTE — ED Provider Notes (Signed)
History     CSN: [id number]  Arrival date & time 07/07/12  0913   First MD Initiated Contact with Patient 07/07/12 [phone]    Chief Complaint Chest Pain  HPI: Brian Buchanan is a 21 yo AAM with history of bipolar disorder presents chest pain. Symptoms have been intermittent for several months. During his last psychiatric admission he was started on propranolol which resolved his pain. He ran out of this medication two weeks ago. Since that time his chest pain has recurred. Located over the anterior left chest and axilla, describes as "hurts," non-radiating, intermittent in nature, worse with arm movement and laying on his left side, improved with laying still. He has not taken any medication for relief. Endorses SOB with the pain but this resolves spontaneously. No diaphoresis, nausea, cough, fever or recent illness. He denies exertional CP or SOB, no syncope, no significant risk factors for PE. Only risk factors for ACS is tobacco abuse. No family history of early cardiac death.   Past Medical History  Diagnosis Date  . Eczema 05/10/2012  . Bipolar affective   . Depression    No past surgical history on file.  No family history on file.  History  Substance Use Topics  . Smoking status: Current Every Day Smoker -- 0.2 packs/day for 4 years    Types: Cigarettes  . Smokeless tobacco: Never Used  . Alcohol Use: No     Review of Systems  Constitutional: Negative for fever, chills and fatigue.  HENT: Negative for congestion and rhinorrhea.   Eyes: Negative for photophobia and visual disturbance.  Respiratory: Positive for shortness of breath. Negative for cough and wheezing.   Cardiovascular: Positive for chest pain. Negative for palpitations.  Gastrointestinal: Negative for nausea, vomiting and abdominal pain.  Genitourinary: Negative for dysuria, urgency, hematuria and decreased urine volume.  Musculoskeletal: Negative for myalgias, back pain and arthralgias.  Skin: Negative for pallor  and rash.  Neurological: Negative for dizziness, syncope, weakness and headaches.  Psychiatric/Behavioral: Negative for confusion and agitation.  All other systems reviewed and are negative.   Allergies  Review of patient's allergies indicates no known allergies.  Home Medications   Current Outpatient Rx  Name  Route  Sig  Dispense  Refill  . NICOTINE 7 MG/24HR TD PT24   Transdermal   Place 1 patch onto the skin daily.   28 patch   0   . PROPRANOLOL HCL ER 60 MG PO CP24   Oral   Take 1 capsule (60 mg total) by mouth daily.   30 capsule   0   . RISPERIDONE 0.5 MG PO TABS   Oral   Take 3 tablets (1.5 mg total) by mouth at bedtime.   90 tablet   0   . TRAZODONE HCL 50 MG PO TABS   Oral   Take 1 tablet (50 mg total) by mouth at bedtime and may repeat dose one time if needed.   30 tablet   0    BP 111/68  Pulse 64  Temp 98.1 F (36.7 C) (Oral)  Resp 18  SpO2 100%  Physical Exam  Constitutional: He is oriented to person, place, and time. He appears well-developed and well-nourished. He is cooperative. No distress.  HENT:  Head: Normocephalic and atraumatic.  Mouth/Throat: Oropharynx is clear and moist and mucous membranes are normal.  Eyes: Conjunctivae normal and EOM are normal. Pupils are equal, round, and reactive to light.  Neck: Trachea normal and normal range of motion.  Cardiovascular: Normal rate, regular rhythm, S1 normal, S2 normal and normal heart sounds.  Exam reveals no decreased pulses.   Pulmonary/Chest: Effort normal and breath sounds normal. He has no decreased breath sounds.  Abdominal: Soft. Normal appearance and bowel sounds are normal. There is no tenderness. There is no CVA tenderness.  Neurological: He is alert and oriented to person, place, and time. GCS eye subscore is 4. GCS verbal subscore is 5. GCS motor subscore is 6.   ED Course  Procedures   Labs Reviewed - No data to display Dg Chest 2 View  07/07/2012  *RADIOLOGY REPORT*   Clinical Data: Chest pain  CHEST - 2 VIEW  Comparison: None  Findings: The heart size and mediastinal contours are within normal limits.  Both lungs are clear.  The visualized skeletal structures are unremarkable.  IMPRESSION: Negative exam.   Original Report Authenticated By: Signa Kell, M.D.    1. Chest wall pain    MDM  21 yo AAM with history of bipolar disorder presents with left sided chest pain. Afebrile, vital signs stable. Doubt PE as he is low risk, PERC negative. Doubt ACS as ECG without ischemia. Doubt HOCM as no murmur or exertional symptoms. Character of pain and exacerbating factors more consistent with MSK pain. There may be a component of anxiety as well perhaps this is why propranolol has been effective. His CXR is without acute disease. Felt patient was stable for outpatient management as pain is likely MSK vs anxiety, HD stable, negative ECG and CXR. Can f/u with PCP as scheduled. Will refill propranolol. Return precautions given. Patient and his mother in agreement with plan.   Reviewed imaging and previous medical records and utilized in MDM  Discussed case with Dr. Oletta Lamas  Clinical Impression 1. Chest wall pain 2. History of anxiety 3. History of bipolar disorder   ECG: SR, rate 67, normal axis, normal intervals. No diagnostic ST or T wave abnormalities. Abnormalities in aVL favored to be lead placement. No WPW, prolonged QT or Brugada syndrome. No previous for comparison        Margie Billet, MD 07/07/12 1227

## 2013-02-06 ENCOUNTER — Emergency Department (HOSPITAL_COMMUNITY)
Admission: EM | Admit: 2013-02-06 | Discharge: 2013-02-06 | Disposition: A | Discharge status: Home / Self Care | Payer: Medicaid Other | Attending: Emergency Medicine | Admitting: Emergency Medicine

## 2013-02-06 ENCOUNTER — Encounter (HOSPITAL_COMMUNITY): Payer: Self-pay | Admitting: Emergency Medicine

## 2013-02-06 DIAGNOSIS — A539 Syphilis, unspecified: Secondary | ICD-10-CM

## 2013-02-06 DIAGNOSIS — L293 Anogenital pruritus, unspecified: Secondary | ICD-10-CM | POA: Insufficient documentation

## 2013-02-06 DIAGNOSIS — Z8659 Personal history of other mental and behavioral disorders: Secondary | ICD-10-CM | POA: Insufficient documentation

## 2013-02-06 DIAGNOSIS — Z79899 Other long term (current) drug therapy: Secondary | ICD-10-CM | POA: Insufficient documentation

## 2013-02-06 DIAGNOSIS — Z872 Personal history of diseases of the skin and subcutaneous tissue: Secondary | ICD-10-CM | POA: Insufficient documentation

## 2013-02-06 DIAGNOSIS — F17200 Nicotine dependence, unspecified, uncomplicated: Secondary | ICD-10-CM | POA: Insufficient documentation

## 2013-02-06 LAB — HIV ANTIBODY (ROUTINE TESTING W REFLEX): HIV: NONREACTIVE

## 2013-02-06 MED ORDER — PENICILLIN G BENZATHINE 1200000 UNIT/2ML IM SUSP
2.4000 10*6.[IU] | Freq: Once | INTRAMUSCULAR | Status: AC
  Administered 2013-02-06: 2.4 10*6.[IU] via INTRAMUSCULAR
  Filled 2013-02-06: qty 4

## 2013-02-06 NOTE — ED Notes (Addendum)
Patient presents to ED with complaints of scrotal pain for the past couple months and worse everyday for the past month.  Mom also states patient is having symptoms of scabies.

## 2013-02-06 NOTE — ED Provider Notes (Signed)
History    CSN: [id number] Arrival date & time 02/06/13  1137  First MD Initiated Contact with Patient 02/06/13 1310     Chief Complaint  Patient presents with  . Testicle Pain   (Consider location/radiation/quality/duration/timing/severity/associated sxs/prior Treatment) HPI Comments: Aravind Chrismer is a 22 y.o. Male presents for evaluation of sore on his penis. He also has generalized perineal itching. He denies urethral discharge. He has bilateral testicular tenderness. The symptoms have been present for at least one week. He has had similar problems, in the past. He denies any known sexual transmitted disease. He believes he was treated for syphilis in the past, but he is not sure. There are no other known modifying factors.   Patient is a 22 y.o. male presenting with testicular pain. The history is provided by the patient and a parent.  Testicle Pain   Past Medical History  Diagnosis Date  . Eczema 05/10/2012  . Bipolar affective   . Depression    History reviewed. No pertinent past surgical history. History reviewed. No pertinent family history. History  Substance Use Topics  . Smoking status: Current Every Day Smoker -- 0.25 packs/day for 4 years    Types: Cigarettes  . Smokeless tobacco: Never Used  . Alcohol Use: No    Review of Systems  Genitourinary: Positive for testicular pain.  All other systems reviewed and are negative.    Allergies  Review of patient's allergies indicates no known allergies.  Home Medications   Current Outpatient Rx  Name  Route  Sig  Dispense  Refill  . propranolol ER (INDERAL LA) 60 MG 24 hr capsule   Oral   Take 1 capsule (60 mg total) by mouth daily.   30 capsule   0    BP 129/77  Pulse 63  Temp(Src) 97.8 F (36.6 C) (Oral)  Resp 20  SpO2 100% Physical Exam  Nursing note and vitals reviewed. Constitutional: He is oriented to person, place, and time. He appears well-developed and well-nourished.  HENT:  Head:  Normocephalic and atraumatic.  Right Ear: External ear normal.  Left Ear: External ear normal.  Eyes: Conjunctivae and EOM are normal. Pupils are equal, round, and reactive to light.  Neck: Normal range of motion and phonation normal. Neck supple.  Cardiovascular: Normal rate, regular rhythm, normal heart sounds and intact distal pulses.   Pulmonary/Chest: Effort normal and breath sounds normal. He exhibits no bony tenderness.  Abdominal: Soft. Normal appearance. There is no tenderness.  Genitourinary:  He is uncircumcised. No urethral discharge. Left shaft of penis, distal third, has a 2 mm, somewhat raised, skin colored lesion, with central umbilication; without clear ulcer. No evidence for vesical. No other penile or scrotal lesions. Testicles are mildly tender, bilaterally, but not enlarged. Mild, bilateral inguinal adenopathy, left greater than right.  Musculoskeletal: Normal range of motion.  Neurological: He is alert and oriented to person, place, and time. He has normal strength. No cranial nerve deficit or sensory deficit. He exhibits normal muscle tone. Coordination normal.  Skin: Skin is warm, dry and intact.  Psychiatric: He has a normal mood and affect. His behavior is normal. Judgment and thought content normal.    ED Course  Procedures (including critical care time) Medications  penicillin g benzathine (BICILLIN LA) 1200000 UNIT/2ML injection 2.4 Million Units (2.4 Million Units Intramuscular Given 02/06/13 1400)        Labs Reviewed  GC/CHLAMYDIA PROBE AMP  RPR  HIV ANTIBODY (ROUTINE TESTING)    1. 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 Doubt urethritis or epididymitis. He has had screening HIV and RPR test done. He is stable for discharge, with outpatient followup   Nursing Notes Reviewed/ Care Coordinated Applicable Imaging Reviewed Interpretation of Laboratory Data incorporated into ED treatment   Plan:  Home Medications- none at d/c; Home Treatments- Avoid sex until rechecked; return here if the recommended treatment, does not improve the symptoms; Recommended follow up- STD clinic at [facility]. In 1-2 weeks.  Flint Melter, MD 02/06/13 [phone]

## 2013-02-06 NOTE — ED Notes (Signed)
Unable to obtain e-signature.  Signature pad not working.  Pt verbalized understanding of d/c and f/u.  No additional questions by pt regarding d/c.  VSS

## 2014-07-23 IMAGING — CR DG CHEST 2V
2 series · 2 of 2 positions shown · non-contrast
Comparison: None

CLINICAL DATA: Chest pain

CHEST - 2 VIEW

[w chest pa]
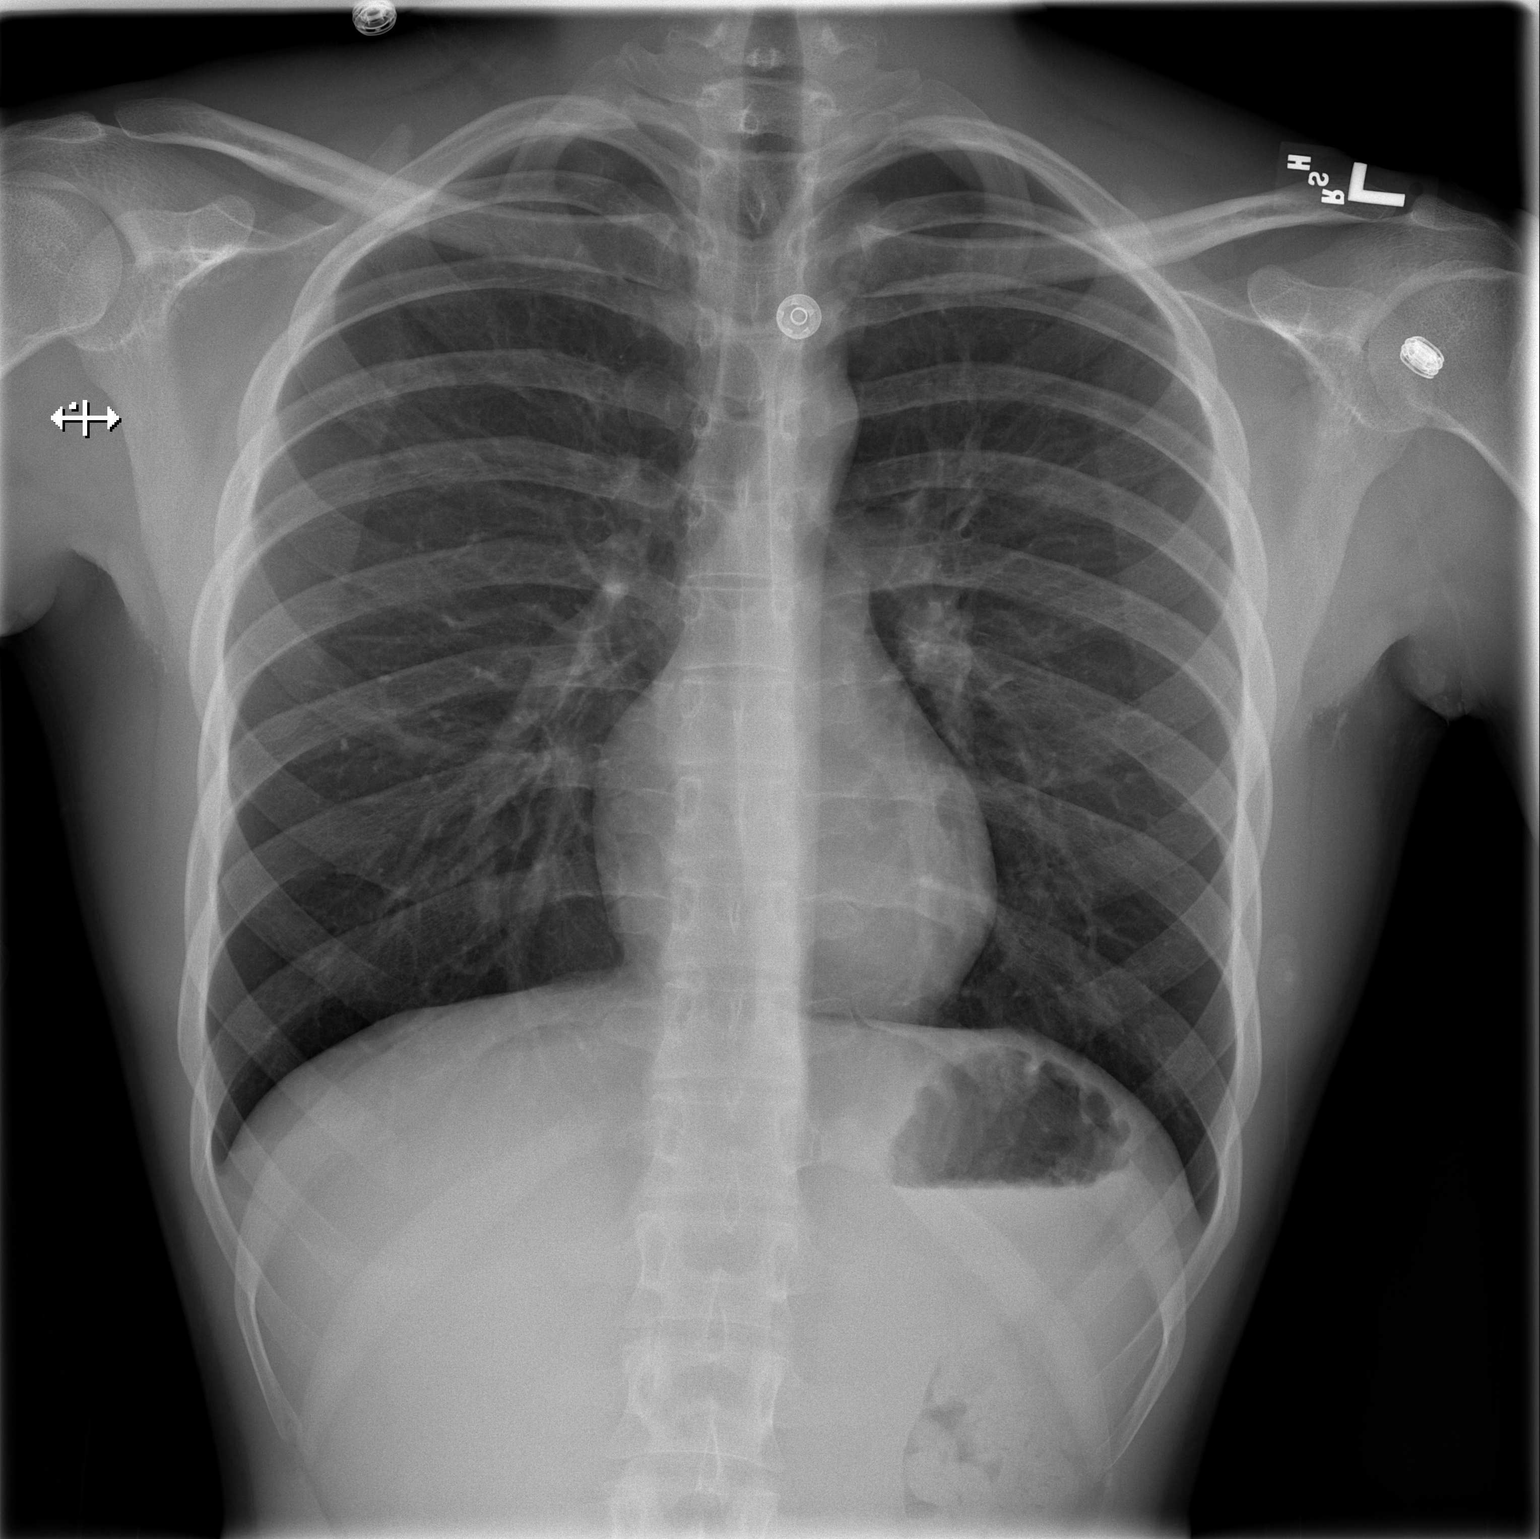

[w chest lat]
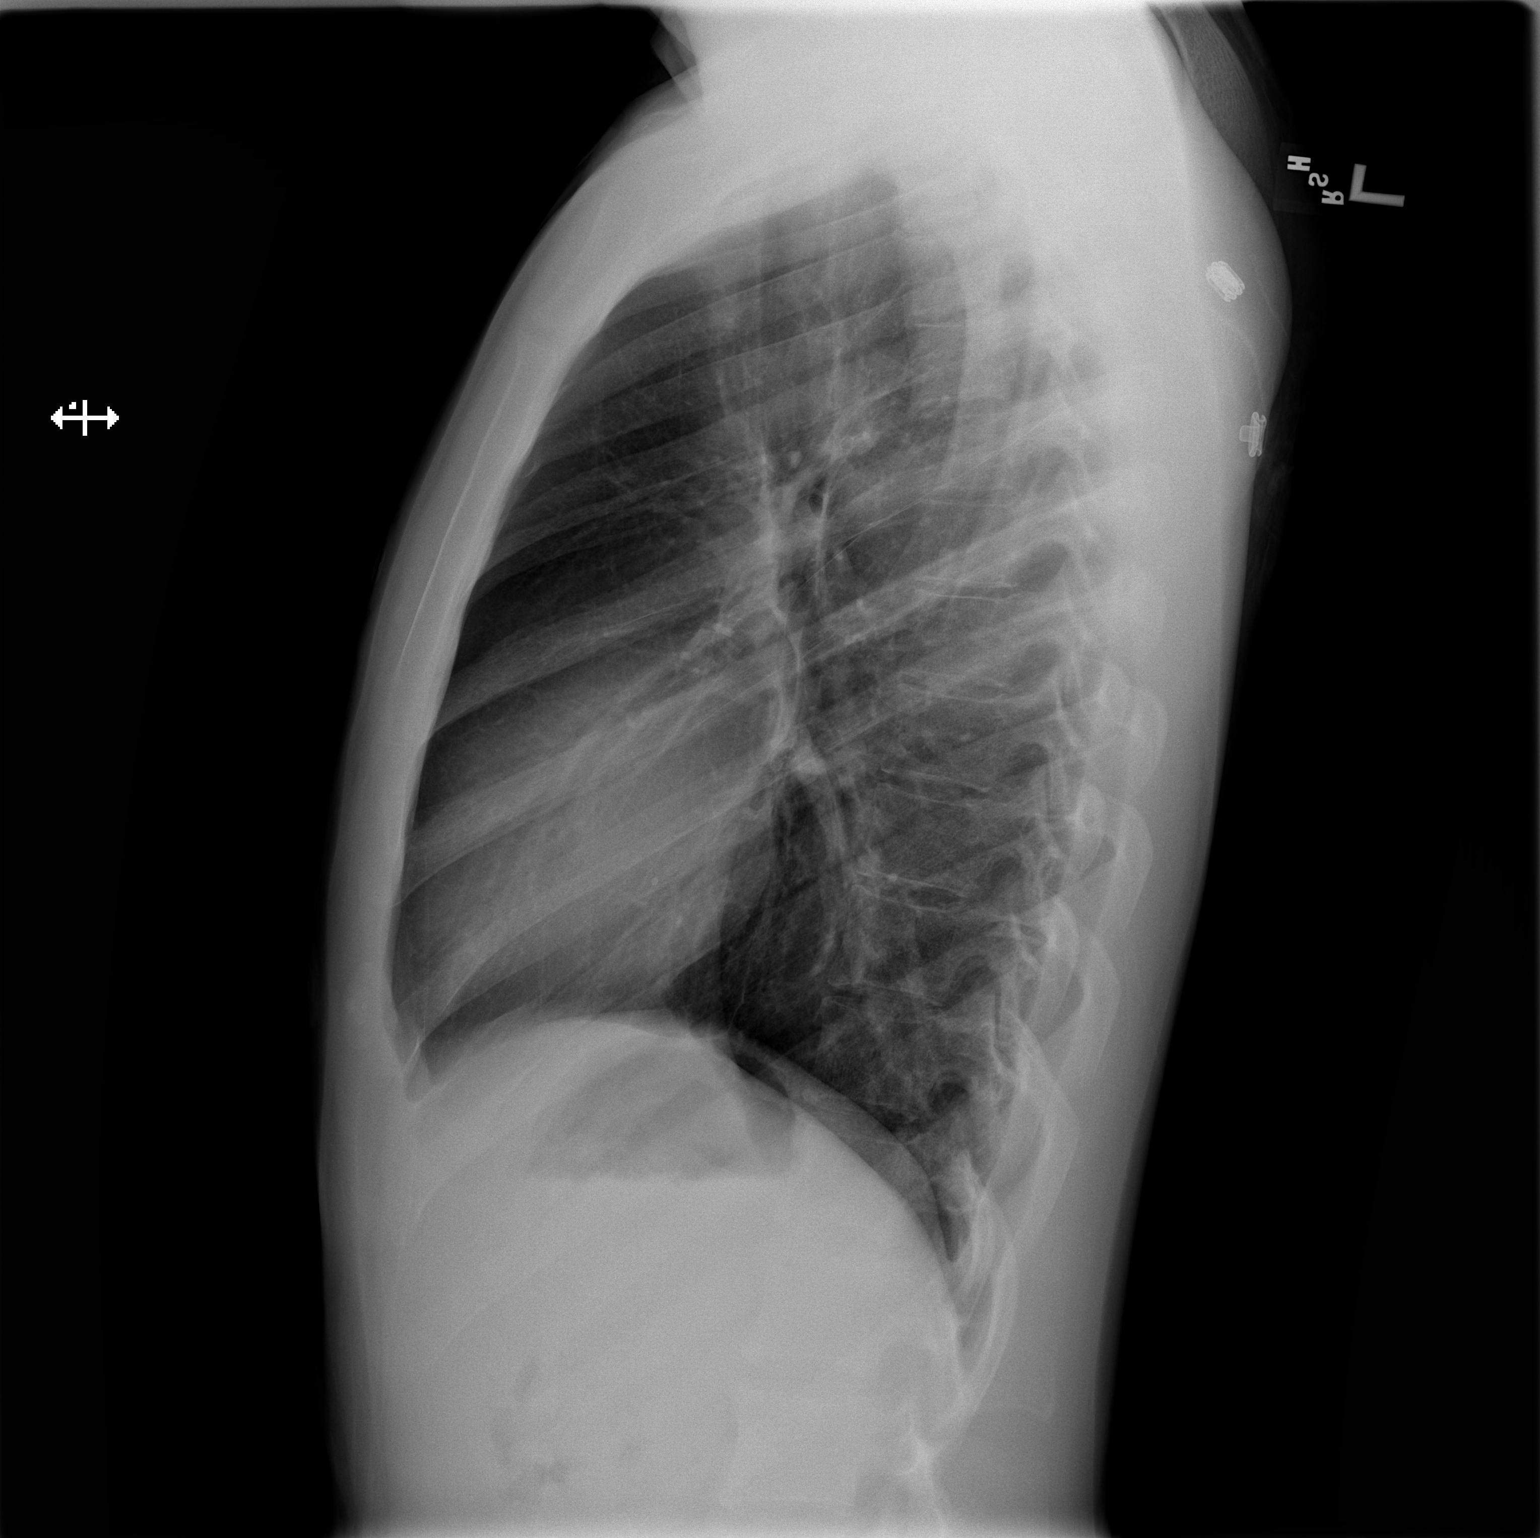

[2 of 2 positions shown; findings below may reference images not displayed]

FINDINGS: The heart size and mediastinal contours are within normal
limits.  Both lungs are clear.  The visualized skeletal structures
are unremarkable.
IMPRESSION: Negative exam.

## 2015-01-25 ENCOUNTER — Encounter (HOSPITAL_COMMUNITY): Payer: Self-pay | Admitting: Nurse Practitioner

## 2015-01-25 ENCOUNTER — Emergency Department (HOSPITAL_COMMUNITY)
Admission: EM | Admit: 2015-01-25 | Discharge: 2015-01-25 | Disposition: A | Discharge status: Home / Self Care | Payer: Medicaid Other | Attending: Emergency Medicine | Admitting: Emergency Medicine

## 2015-01-25 DIAGNOSIS — Z79899 Other long term (current) drug therapy: Secondary | ICD-10-CM | POA: Diagnosis not present

## 2015-01-25 DIAGNOSIS — Z872 Personal history of diseases of the skin and subcutaneous tissue: Secondary | ICD-10-CM | POA: Insufficient documentation

## 2015-01-25 DIAGNOSIS — Z8659 Personal history of other mental and behavioral disorders: Secondary | ICD-10-CM | POA: Diagnosis not present

## 2015-01-25 DIAGNOSIS — N509 Disorder of male genital organs, unspecified: Secondary | ICD-10-CM | POA: Diagnosis not present

## 2015-01-25 DIAGNOSIS — N489 Disorder of penis, unspecified: Secondary | ICD-10-CM

## 2015-01-25 DIAGNOSIS — Z720 Tobacco use: Secondary | ICD-10-CM | POA: Diagnosis not present

## 2015-01-25 DIAGNOSIS — R21 Rash and other nonspecific skin eruption: Secondary | ICD-10-CM | POA: Diagnosis present

## 2015-01-25 MED ORDER — PENICILLIN G BENZATHINE 1200000 UNIT/2ML IM SUSP
2.4000 10*6.[IU] | Freq: Once | INTRAMUSCULAR | Status: AC
  Administered 2015-01-25: 2.4 10*6.[IU] via INTRAMUSCULAR
  Filled 2015-01-25: qty 4

## 2015-01-25 MED ORDER — PENICILLIN G BENZATHINE 1200000 UNIT/2ML IM SUSP: 1.2000 10*6.[IU] | Freq: Once | INTRAMUSCULAR | Status: DC

## 2015-01-25 NOTE — ED Notes (Signed)
The patient is aware that a urine specimen is needed. 

## 2015-01-25 NOTE — ED Provider Notes (Signed)
CSN: [id number]     Arrival date & time 01/25/15  1243 History  This chart was scribed for Brian Skeen, PA-C, working with Cathren Laine, MD by Elon Spanner, ED Scribe. This patient was seen in room TR07C/TR07C and the patient's care was started at 1:30 PM.   Chief Complaint  Patient presents with  . Rash   The history is provided by the patient. No language interpreter was used.   HPI Comments: Brian Buchanan is a 24 y.o. male who presents to the Emergency Department complaining of a rash on his penis onset this morning. No pain. The patient reports he had unprotected sex last week with a partner whom he normally wears protection with.  His partner is asymptomatic.  The patient reports a similar rash 2 years ago after sitting naked on the floor of his apartment.  At that time he was diagnosed with scabies which was treated with a topical cream, and had a negative syphilis test.  He denies penile pain, dysuria, frequency, nausea, vomiting.   Past Medical History  Diagnosis Date  . Eczema 05/10/2012  . Bipolar affective   . Depression    History reviewed. No pertinent past surgical history. No family history on file. History  Substance Use Topics  . Smoking status: Current Every Day Smoker -- 0.25 packs/day for 4 years    Types: Cigarettes  . Smokeless tobacco: Never Used  . Alcohol Use: No    Review of Systems  Gastrointestinal: Negative for nausea, vomiting and diarrhea.  Genitourinary: Negative for dysuria and frequency.  Skin: Positive for rash.  All other systems reviewed and are negative.     Allergies  Review of patient's allergies indicates no known allergies.  Home Medications   Prior to Admission medications   Medication Sig Start Date End Date Taking? Authorizing Provider  propranolol ER (INDERAL LA) 60 MG 24 hr capsule Take 1 capsule (60 mg total) by mouth daily. 05/14/12   Mike Craze, MD   BP 133/79 mmHg  Pulse 62  Temp(Src) 97.7 F (36.5 C) (Oral)  Resp  17  SpO2 99% Physical Exam  Constitutional: He is oriented to person, place, and time. He appears well-developed and well-nourished. No distress.  HENT:  Head: Normocephalic and atraumatic.  Eyes: Conjunctivae and EOM are normal.  Neck: Normal range of motion. Neck supple.  Cardiovascular: Normal rate, regular rhythm and normal heart sounds.   Pulmonary/Chest: Effort normal and breath sounds normal.  Genitourinary: Testes normal.    Circumcised. No penile tenderness. No discharge found.  Musculoskeletal: Normal range of motion. He exhibits no edema.  Neurological: He is alert and oriented to person, place, and time.  Skin: Skin is warm and dry.  Psychiatric: His behavior is normal.  Strange affect.  Nursing note and vitals reviewed.   ED Course  Procedures (including critical care time)  DIAGNOSTIC STUDIES: Oxygen Saturation is 99% on RA, normal by my interpretation.    COORDINATION OF CARE:  1:35 PM Discussed treatment plan with patient at bedside.  Patient acknowledges and agrees with plan.     Labs Review Labs Reviewed  RPR  HIV ANTIBODY (ROUTINE TESTING)  GC/CHLAMYDIA PROBE AMP ([place]) NOT AT [facility]    Imaging Review No results found.   EKG Interpretation None      MDM   Final diagnoses:  Penile lesion   NAD. AFVSS. The lesion may be an early chancre. It is painless. RPR/HIV pending. Urine GC/chlamydia pending. Patient refuses genital swab. Treated  prophylactically with penicillin. Resources given for PCP f/u. Stable for d/c. Return precautions given. Patient states understanding of treatment care plan and is agreeable.  I personally performed the services described in this documentation, which was scribed in my presence. The recorded information has been reviewed and is accurate.  Kathrynn SpeedRobyn M Melek Pownall, PA-C 01/25/15 1437  Cathren LaineKevin Steinl, MD 01/25/15 [phone]

## 2015-01-25 NOTE — ED Notes (Signed)
Pt sts had bump on tip of penis. sts has had it in the past was told it was scabies. Patient sts is staying in a place that might have scabies. Patient denies pain, itching, or penile discharge. Pt endorses having unprotected sex on Monday.

## 2015-01-25 NOTE — Discharge Instructions (Signed)
You were treated today for possible syphilis. The labs are pending. If they are positive you will be contacted.  Syphilis Syphilis is an infectious disease. It can cause serious complications if left untreated.  CAUSES  Syphilis is caused by a type of bacteria called Treponema pallidum. It is most commonly spread through sexual contact. Syphilis may also spread to a fetus through the blood of the mother.  SIGNS AND SYMPTOMS Symptoms vary depending on the stage of the disease. Some symptoms may disappear without treatment. However, this does not mean that the infection is gone. One form of syphilis (called latent syphilis) has no symptoms.  Primary Syphilis  Painless sores (chancres) in and around the genital organs and mouth.  Swollen lymph nodes near the sores. Secondary Syphilis  A rash or sores over any portion of the body, including the palms of the hands and soles of the feet.  Fever.  Headache.  Sore throat.  Swollen lymph nodes.  New sores in the mouth or on the genitals.  Feeling generally ill.  Having pain in the joints. Tertiary Syphilis The third stage of syphilis involves severe damage to different organs in the body, such as the brain, spinal cord, and heart. Signs and symptoms may include:   Dementia.  Personality and mood changes.  Difficulty walking.  Heart failure.  Fainting.  Enlargement (aneurysm) of the aorta.  Tumors of the skin, bones, or liver.  Muscle weakness.  Sudden "lightning" pains, numbness, or tingling.  Problems with coordination.  Vision changes. DIAGNOSIS   A physical exam will be done.  Blood tests will be done to confirm the diagnosis.  If the disease is in the first or second stages, a fluid (drainage) sample from a sore or rash may be examined under a microscope to detect the disease-causing bacteria.  Fluid around the spine may need to be examined to detect brain damage or inflammation of the brain lining  (meningitis).  If the disease is in the third stage, X-rays, CT scans, MRIs, echocardiograms, ultrasounds, or cardiac catheterization may also be done to detect disease of the heart, aorta, or brain. TREATMENT  Syphilis can be cured with antibiotic medicine if a diagnosis is made early. During the first day of treatment, you may experience fever, chills, headache, nausea, or aching all over your body. This is a normal reaction to the antibiotics.  HOME CARE INSTRUCTIONS   Take your antibiotic medicine as directed by your health care provider. Finish the antibiotic even if you start to feel better. Incomplete treatment will put you at risk for continued infection and could be life threatening.  Take medicines only as directed by your health care provider.  Do not have sexual intercourse until your treatment is completed or as directed by your health care provider.  Inform your recent sexual partners that you were diagnosed with syphilis. They need to seek care and treatment, even if they have no symptoms. It is necessary that all your sexual partners be tested for infection and treated if they have the disease.  Keep all follow-up visits as directed by your health care provider. It is important to keep all your appointments.  If your test results are not ready during your visit, make an appointment with your health care provider to find out the results. Do not assume everything is normal if you have not heard from your health care provider or the medical facility. It is your responsibility to get your test results. SEEK MEDICAL CARE IF:  You continue to have any of the following 24 hours after beginning treatment:  Fever.  Chills.  Headache.  Nausea.  Aching all over your body.  You have symptoms of an allergic reaction to medicine, such as:  Chills.  A headache.  Light-headedness.  A new rash (especially hives).  Difficulty breathing. MAKE SURE YOU:   Understand these  instructions.  Will watch your condition.  Will get help right away if you are not doing well or get worse. Document Released: 05/05/2013 Document Revised: 11/29/2013 Document Reviewed: 05/05/2013 [facility] Patient Information 2015 [place], [place]. This information is not intended to replace advice given to you by your health care provider. Make sure you discuss any questions you have with your health care provider.

## 2015-01-26 LAB — GC/CHLAMYDIA PROBE AMP (~~LOC~~) NOT AT ARMC
Chlamydia: NEGATIVE
Neisseria Gonorrhea: NEGATIVE

## 2015-01-26 LAB — HIV ANTIBODY (ROUTINE TESTING W REFLEX): HIV SCREEN 4TH GENERATION: NONREACTIVE

## 2015-01-26 LAB — RPR: RPR Ser Ql: NONREACTIVE

## 2015-02-17 ENCOUNTER — Telehealth (HOSPITAL_COMMUNITY): Payer: Self-pay
